# Patient Record
Sex: Female | Born: 2000 | Race: White | Hispanic: No | Smoking: Never smoker
Health system: Southern US, Community
[De-identification: ages and names within clinical notes are randomized; demographics above are authoritative.]

## PROBLEM LIST (undated history)

## (undated) DIAGNOSIS — F988 Other specified behavioral and emotional disorders with onset usually occurring in childhood and adolescence: Secondary | ICD-10-CM

## (undated) HISTORY — PX: WISDOM TOOTH EXTRACTION: SHX21

## (undated) HISTORY — DX: Other specified behavioral and emotional disorders with onset usually occurring in childhood and adolescence: F98.8

---
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
## 2018-07-18 ENCOUNTER — Other Ambulatory Visit: Payer: Self-pay

## 2018-07-18 ENCOUNTER — Encounter (HOSPITAL_COMMUNITY): Payer: Self-pay | Admitting: Emergency Medicine

## 2018-07-18 ENCOUNTER — Emergency Department (HOSPITAL_COMMUNITY): Payer: BLUE CROSS/BLUE SHIELD

## 2018-07-18 ENCOUNTER — Emergency Department (HOSPITAL_COMMUNITY)
Admission: EM | Admit: 2018-07-18 | Discharge: 2018-07-18 | Disposition: A | Discharge status: Home / Self Care | Payer: BLUE CROSS/BLUE SHIELD | Attending: Emergency Medicine | Admitting: Emergency Medicine

## 2018-07-18 DIAGNOSIS — Y939 Activity, unspecified: Secondary | ICD-10-CM | POA: Insufficient documentation

## 2018-07-18 DIAGNOSIS — S8002XA Contusion of left knee, initial encounter: Secondary | ICD-10-CM

## 2018-07-18 DIAGNOSIS — S0990XA Unspecified injury of head, initial encounter: Secondary | ICD-10-CM

## 2018-07-18 DIAGNOSIS — Y999 Unspecified external cause status: Secondary | ICD-10-CM | POA: Insufficient documentation

## 2018-07-18 DIAGNOSIS — Y92410 Unspecified street and highway as the place of occurrence of the external cause: Secondary | ICD-10-CM | POA: Diagnosis not present

## 2018-07-18 DIAGNOSIS — Z79899 Other long term (current) drug therapy: Secondary | ICD-10-CM | POA: Insufficient documentation

## 2018-07-18 NOTE — ED Notes (Signed)
Delay explained to patient. NAD noted. 

## 2018-07-18 NOTE — Discharge Instructions (Addendum)
X-rays were normal.  You will be sore for several days.  Tylenol or ibuprofen for pain.  Elevate knee.  Ice pack.
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
## 2018-12-02 ENCOUNTER — Other Ambulatory Visit: Payer: Self-pay

## 2018-12-02 DIAGNOSIS — Z20822 Contact with and (suspected) exposure to covid-19: Secondary | ICD-10-CM

## 2018-12-04 LAB — NOVEL CORONAVIRUS, NAA: SARS-CoV-2, NAA: NOT DETECTED

## 2020-03-03 ENCOUNTER — Ambulatory Visit (INDEPENDENT_AMBULATORY_CARE_PROVIDER_SITE_OTHER): Payer: BC Managed Care – PPO | Admitting: Nurse Practitioner

## 2020-03-03 ENCOUNTER — Other Ambulatory Visit: Payer: Self-pay

## 2020-03-03 ENCOUNTER — Encounter: Payer: Self-pay | Admitting: Nurse Practitioner

## 2020-03-03 VITALS — BP 123/89 | HR 116 | Temp 98.2°F | Resp 20 | Ht 62.0 in | Wt 149.0 lb

## 2020-03-03 DIAGNOSIS — F909 Attention-deficit hyperactivity disorder, unspecified type: Secondary | ICD-10-CM | POA: Diagnosis not present

## 2020-03-03 DIAGNOSIS — Z7689 Persons encountering health services in other specified circumstances: Secondary | ICD-10-CM | POA: Insufficient documentation

## 2020-03-03 MED ORDER — LISDEXAMFETAMINE DIMESYLATE 20 MG PO CAPS: 20.0000 mg | ORAL_CAPSULE | Freq: Every day | ORAL | 0 refills | Status: DC

## 2020-03-03 NOTE — Patient Instructions (Signed)

## 2020-03-03 NOTE — Assessment & Plan Note (Signed)
Vyvanse reordered. Education provided with printed handouts given Rx sent to pharmacy Follow-up in 3 months, goal is to switch patient to Strattera and nonstimulant ADHD medication.

## 2020-03-03 NOTE — Progress Notes (Signed)
New Patient Note  RE: Sara Petersen MRN: [id number] DOB: 12/10/2000 Date of Office Visit: 03/03/2020  Chief Complaint: Establish Care    History of Present Illness:   Patient is a 20 year old female who presents to clinic for establishing care/follow-up of ADHD.  Patient is reporting difficulty concentrating, patient was first diagnosed for ADHD in fourth grade and has not been reevaluated since then.  Patient is currently on Vyvanse and reports medication is therapeutic for symptoms.  Patient is compliant with current treatment regimen and has no side effects from therapy.  Assessment and Plan:  Sara Petersen is a 20 y.o. female with: Establishing care with new doctor, encounter for Patient is establishing care with practice.  Completed head to toe assessment.  Patient will schedule an appointment for complete physical and labs.  Patient reports a diagnosis of ADHD.  Advised patient to transfer all medical records.  Reordered 1 month refill Vyvanse.  Reevaluation for ADHD by psychiatry needed.  Patient will follow up in 3 months.  Hyperactivity Vyvanse reordered. Education provided with printed handouts given Rx sent to pharmacy Follow-up in 3 months, goal is to switch patient to Strattera and nonstimulant ADHD medication.    Return in about 3 months (around 06/01/2020).   Diagnostics:     Past Medical History: Patient Active Problem List   Diagnosis Date Noted  . Hyperactivity 03/03/2020  . Establishing care with new doctor, encounter for 03/03/2020   Past Medical History:  Diagnosis Date  . ADD (attention deficit disorder)     Past Surgical History: Past Surgical History:  Procedure Laterality Date  . WISDOM TOOTH EXTRACTION     Medication List:  No current outpatient medications on file.   No current facility-administered medications for this visit.  Allergies: No Known Allergies Social History: Social History   Socioeconomic History  . Marital  status: Single    Spouse name: Not on file  . Number of children: Not on file  . Years of education: Not on file  . Highest education level: Not on file  Occupational History  . Not on file  Tobacco Use  . Smoking status: Never Smoker  . Smokeless tobacco: Never Used  Vaping Use  . Vaping Use: Never used  Substance and Sexual Activity  . Alcohol use: No  . Drug use: No  . Sexual activity: Not on file  Other Topics Concern  . Not on file  Social History Narrative  . Not on file   Social Determinants of Health   Financial Resource Strain: Not on file  Food Insecurity: Not on file  Transportation Needs: Not on file  Physical Activity: Not on file  Stress: Not on file  Social Connections: Not on file       Family History: Family History  Problem Relation Age of Onset  . Heart attack Other   . Diabetes Father   . Kidney disease Maternal Grandmother   . Stroke Maternal Grandmother   . Diabetes Maternal Grandfather   . Heart disease Paternal Grandfather          Review of Systems  HENT: Negative.   Eyes: Negative.   Respiratory: Negative.   Cardiovascular: Negative.   Gastrointestinal: Negative.   Genitourinary: Negative.   Musculoskeletal: Negative.   Skin: Negative.   Psychiatric/Behavioral:       Hyperactivity  All other systems reviewed and are negative.  Objective: BP 123/89   Pulse (!) 116   Temp 98.2 F (36.8 C) (Temporal)   Resp 20  Ht 5\' 2"  (1.575 m)   Wt 149 lb (67.6 kg)   SpO2 99%   BMI 27.25 kg/m  Body mass index is 27.25 kg/m. Physical Exam Vitals reviewed.  Constitutional:      General: She is awake.     Appearance: Normal appearance. She is well-groomed.     Interventions: Face mask in place.  HENT:     Head: Normocephalic.     Mouth/Throat:     Mouth: Mucous membranes are moist.     Pharynx: Oropharynx is clear.  Eyes:     Conjunctiva/sclera: Conjunctivae normal.     Pupils: Pupils are equal, round, and reactive to light.   Cardiovascular:     Rate and Rhythm: Normal rate and regular rhythm.     Pulses: Normal pulses.     Heart sounds: Normal heart sounds.  Pulmonary:     Effort: Pulmonary effort is normal.  Abdominal:     General: Bowel sounds are normal.  Musculoskeletal:        General: Normal range of motion.  Skin:    General: Skin is warm.  Neurological:     Mental Status: She is alert and oriented to person, place, and time.  Psychiatric:        Behavior: Behavior is cooperative.    The plan was reviewed with the patient/family, and all questions/concerned were addressed.  It was my pleasure to see today and participate in her care. Please feel free to contact me with any questions or concerns.  Sincerely,  [place] NP [facility]

## 2020-03-03 NOTE — Assessment & Plan Note (Signed)
Patient is establishing care with practice.  Completed head to toe assessment.  Patient will schedule an appointment for complete physical and labs.  Patient reports a diagnosis of ADHD.  Advised patient to transfer all medical records.  Reordered 1 month refill Vyvanse.  Reevaluation for ADHD by psychiatry needed.  Patient will follow up in 3 months.

## 2020-03-10 ENCOUNTER — Telehealth: Payer: Self-pay | Admitting: Nurse Practitioner

## 2020-03-10 NOTE — Telephone Encounter (Signed)
Patient wants to know if JE has received her records ? If so can her vyvanse dose be changed ?

## 2020-03-10 NOTE — Telephone Encounter (Signed)
Pt calling because when Je wrote prescription for vyvanse it was 20 mg and pt stated that she usually takes 40 mg. Made pt appt for 03/23/20 because she stated that Je wanted a blood test done after her records were sent to [place], which they were on 03/08/20

## 2020-03-10 NOTE — Telephone Encounter (Signed)
Lmtcb.

## 2020-03-10 NOTE — Telephone Encounter (Signed)
I will check on Friday,I have not received any fax.  for now the most current dose is what we have in Epic. 20 mg

## 2020-03-11 NOTE — Telephone Encounter (Signed)
Lmtcb.

## 2020-03-15 NOTE — Telephone Encounter (Signed)
No call back  Will contact patient once we get records.  Will send to PCP so she can notify us when she receives  records.
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
## 2020-03-23 ENCOUNTER — Ambulatory Visit: Payer: BC Managed Care – PPO | Admitting: Nurse Practitioner

## 2020-03-30 ENCOUNTER — Ambulatory Visit: Payer: BC Managed Care – PPO | Admitting: Nurse Practitioner

## 2020-03-30 ENCOUNTER — Encounter: Payer: Self-pay | Admitting: Nurse Practitioner

## 2020-03-30 ENCOUNTER — Other Ambulatory Visit: Payer: Self-pay

## 2020-03-30 VITALS — BP 114/76 | HR 91 | Temp 98.1°F | Ht 62.0 in | Wt 150.8 lb

## 2020-03-30 DIAGNOSIS — F988 Other specified behavioral and emotional disorders with onset usually occurring in childhood and adolescence: Secondary | ICD-10-CM | POA: Insufficient documentation

## 2020-03-30 DIAGNOSIS — F908 Attention-deficit hyperactivity disorder, other type: Secondary | ICD-10-CM

## 2020-03-30 DIAGNOSIS — Z0289 Encounter for other administrative examinations: Secondary | ICD-10-CM

## 2020-03-30 NOTE — Addendum Note (Signed)
Addended by: Diamantina Monks on: 03/30/2020 04:21 PM   Modules accepted: Orders

## 2020-03-30 NOTE — Assessment & Plan Note (Signed)
Symptoms controlled on current medication dose. Completed drug contract and UDS Rx sent to pharmacy Follow-up in 3 months.

## 2020-03-30 NOTE — Patient Instructions (Signed)

## 2020-03-30 NOTE — Progress Notes (Signed)
Established Patient Office Visit  Subjective:  Patient ID: Sara Petersen, female    DOB: 13-Apr-2000  Age: 20 y.o. MRN: [id number]  CC:  Chief Complaint  Patient presents with  . Medication Management    HPI [name] presents  Received request for refill of ADHD Medication. Daryll Drown, NP Current Outpatient Medications  Medication Sig Dispense Refill  . lisdexamfetamine (VYVANSE) 20 MG capsule Take 1 capsule (20 mg total) by mouth daily. 30 capsule 0   No current facility-administered medications for this visit.   Last refill date of 03/03/2018 Medication: Vyvanse # dispensed: 30 # days of med left: 3 To be picked up at [facility].  Does [place] seem to have any problems with moodiness, appetite, weight loss, or sleep? no Any complaints by [place] about taking the medications? no When was she last examined for ADHD? 03/09/2018 Who is she seeing for her ADHD symptoms? Lynnell Chad  When is her next appointment due?  April   Past Medical History:  Diagnosis Date  . ADD (attention deficit disorder)     Past Surgical History:  Procedure Laterality Date  . WISDOM TOOTH EXTRACTION      Family History  Problem Relation Age of Onset  . Heart attack Other   . Diabetes Father   . Kidney disease Maternal Grandmother   . Stroke Maternal Grandmother   . Diabetes Maternal Grandfather   . Heart disease Paternal Grandfather     Social History   Socioeconomic History  . Marital status: Single    Spouse name: Not on file  . Number of children: Not on file  . Years of education: Not on file  . Highest education level: Not on file  Occupational History  . Not on file  Tobacco Use  . Smoking status: Never Smoker  . Smokeless tobacco: Never Used  Vaping Use  . Vaping Use: Never used  Substance and Sexual Activity  . Alcohol use: No  . Drug use: No  . Sexual activity: Not on file  Other Topics Concern  . Not on file  Social History  Narrative  . Not on file   Social Determinants of Health   Financial Resource Strain: Not on file  Food Insecurity: Not on file  Transportation Needs: Not on file  Physical Activity: Not on file  Stress: Not on file  Social Connections: Not on file  Intimate Partner Violence: Not on file    Outpatient Medications Prior to Visit  Medication Sig Dispense Refill  . lisdexamfetamine (VYVANSE) 20 MG capsule Take 1 capsule (20 mg total) by mouth daily. 30 capsule 0   No facility-administered medications prior to visit.    No Known Allergies  ROS Review of Systems  Constitutional: Negative.   HENT: Negative.   Eyes: Negative.   Respiratory: Negative.   Cardiovascular: Negative.   Gastrointestinal: Negative.   Endocrine: Negative.   Genitourinary: Negative.   Musculoskeletal: Negative.   Skin: Negative.   Neurological: Negative.   Psychiatric/Behavioral: The patient is not nervous/anxious.   All other systems reviewed and are negative.     Objective:    Physical Exam Vitals reviewed.  Constitutional:      Appearance: Normal appearance.  HENT:     Head: Normocephalic.     Nose: Nose normal.  Eyes:     Conjunctiva/sclera: Conjunctivae normal.  Cardiovascular:     Rate and Rhythm: Normal rate and regular rhythm.     Pulses: Normal pulses.  Heart sounds: Normal heart sounds.  Pulmonary:     Effort: Pulmonary effort is normal.  Skin:    General: Skin is warm.  Neurological:     Mental Status: She is alert and oriented to person, place, and time.  Psychiatric:        Behavior: Behavior normal.     BP 114/76   Pulse 91   Temp 98.1 F (36.7 C)   Ht 5\' 2"  (1.575 m)   Wt 150 lb 12.8 oz (68.4 kg)   LMP 02/28/2020   SpO2 100%   BMI 27.58 kg/m  Wt Readings from Last 3 Encounters:  03/30/20 150 lb 12.8 oz (68.4 kg)  03/03/20 149 lb (67.6 kg)  07/18/18 145 lb (65.8 kg)     Health Maintenance Due  Topic Date Due  . Hepatitis C Screening  Never done  .  HIV Screening  Never done  . PAP-Cervical Cytology Screening  Never done  . PAP SMEAR-Modifier  Never done    There are no preventive care reminders to display for this patient.  No results found for: TSH Lab Results  Component Value Date   WBC 6.2 06/19/2014   HGB 13.2 06/19/2014   HCT 39.4 06/19/2014   MCV 84.5 06/19/2014   PLT 245 06/19/2014   Lab Results  Component Value Date   NA 136 06/19/2014   K 4.1 06/19/2014   CO2 26 06/19/2014   GLUCOSE 100 (H) 06/19/2014   BUN 11 06/19/2014   CREATININE 0.63 06/19/2014   BILITOT 0.5 06/19/2014   ALKPHOS 66 06/19/2014   AST 17 06/19/2014   ALT 12 06/19/2014   PROT 7.4 06/19/2014   ALBUMIN 3.9 06/19/2014   CALCIUM 9.9 06/19/2014   ANIONGAP 10 06/19/2014   No results found for: CHOL No results found for: HDL No results found for: LDLCALC No results found for: TRIG No results found for: CHOLHDL No results found for: 06/21/2014    Assessment & Plan:   Problem List Items Addressed This Visit      Other   Attention deficit disorder - Primary    Symptoms controlled on current medication dose. Completed drug contract and UDS Rx sent to pharmacy Follow-up in 3 months.         No orders of the defined types were placed in this encounter.   Follow-up: No follow-ups on file.    [id number], NP

## 2020-03-31 MED ORDER — LISDEXAMFETAMINE DIMESYLATE 40 MG PO CAPS: 40.0000 mg | ORAL_CAPSULE | ORAL | 0 refills | Status: DC

## 2020-03-31 NOTE — Addendum Note (Signed)
Addended by: Daryll Drown on: 03/31/2020 05:10 PM   Modules accepted: Orders

## 2020-04-05 LAB — TOXASSURE SELECT 13 (MW), URINE

## 2020-07-06 ENCOUNTER — Ambulatory Visit (INDEPENDENT_AMBULATORY_CARE_PROVIDER_SITE_OTHER): Payer: BC Managed Care – PPO | Admitting: Nurse Practitioner

## 2020-07-06 ENCOUNTER — Encounter: Payer: Self-pay | Admitting: Nurse Practitioner

## 2020-07-06 ENCOUNTER — Other Ambulatory Visit: Payer: Self-pay

## 2020-07-06 VITALS — BP 117/85 | HR 90 | Temp 97.3°F | Ht 62.0 in | Wt 148.0 lb

## 2020-07-06 DIAGNOSIS — F908 Attention-deficit hyperactivity disorder, other type: Secondary | ICD-10-CM | POA: Diagnosis not present

## 2020-07-06 DIAGNOSIS — R109 Unspecified abdominal pain: Secondary | ICD-10-CM | POA: Insufficient documentation

## 2020-07-06 DIAGNOSIS — F988 Other specified behavioral and emotional disorders with onset usually occurring in childhood and adolescence: Secondary | ICD-10-CM

## 2020-07-06 LAB — URINALYSIS, ROUTINE W REFLEX MICROSCOPIC
Bilirubin, UA: NEGATIVE
Glucose, UA: NEGATIVE
Leukocytes,UA: NEGATIVE
Nitrite, UA: NEGATIVE
Protein,UA: NEGATIVE
RBC, UA: NEGATIVE
Specific Gravity, UA: 1.02 (ref 1.005–1.030)
Urobilinogen, Ur: 0.2 mg/dL (ref 0.2–1.0)
pH, UA: 6 (ref 5.0–7.5)

## 2020-07-06 LAB — MICROSCOPIC EXAMINATION
Bacteria, UA: NONE SEEN
RBC: NONE SEEN /hpf (ref 0–2)

## 2020-07-06 MED ORDER — LISDEXAMFETAMINE DIMESYLATE 40 MG PO CAPS: 40.0000 mg | ORAL_CAPSULE | ORAL | 0 refills | Status: DC

## 2020-07-06 NOTE — Assessment & Plan Note (Signed)
Urinalysis negative for UTI.  Encourage patient to increase hydration, Tylenol or ibuprofen for flank pain.  Follow-up with worsening unresolved symptoms.

## 2020-07-06 NOTE — Patient Instructions (Signed)
Nausea, Adult Nausea is feeling sick to your stomach or feeling that you are about to throw up (vomit). Feeling sick to your stomach is usually not serious, but it may be an early sign of a more serious medical problem. As you feel sicker to your stomach, you may throw up. If you throw up, or if you are not able to drink enough fluids, there is a risk that you may lose too much water in your body (get dehydrated). If you lose too much water in your body, you may:  Feel tired.  Feel thirsty.  Have a dry mouth.  Have cracked lips.  Go pee (urinate) less often. Older adults and people who have other diseases or a weak body defense system (immune system) have a higher risk of losing too much water in the body. The main goals of treating this condition are:  To relieve your nausea.  To ensure your nausea occurs less often.  To prevent throwing up and losing too much fluid. Follow these instructions at home: Watch your symptoms for any changes. Tell your doctor about them. Follow these instructions as told by your doctor. Eating and drinking  Take an ORS (oral rehydration solution). This is a drink that is sold at pharmacies and stores.  Drink clear fluids in small amounts as you are able. These include: ? Water. ? Ice chips. ? Fruit juice that has water added (diluted fruit juice). ? Low-calorie sports drinks.  Eat bland, easy-to-digest foods in small amounts as you are able, such as: ? Bananas. ? Applesauce. ? Rice. ? Low-fat (lean) meats. ? Toast. ? Crackers.  Avoid drinking fluids that have a lot of sugar or caffeine in them. This includes energy drinks, sports drinks, and soda.  Avoid alcohol.  Avoid spicy or fatty foods.      General instructions  Take over-the-counter and prescription medicines only as told by your doctor.  Rest at home while you get better.  Drink enough fluid to keep your pee (urine) pale yellow.  Take slow and deep breaths when you feel  sick to your stomach.  Avoid food or things that have strong smells.  Wash your hands often with soap and water. If you cannot use soap and water, use hand sanitizer.  Make sure that all people in your home wash their hands well and often.  Keep all follow-up visits as told by your doctor. This is important. Contact a doctor if:  You feel sicker to your stomach.  You feel sick to your stomach for more than 2 days.  You throw up.  You are not able to drink fluids without throwing up.  You have new symptoms.  You have a fever.  You have a headache.  You have muscle cramps.  You have a rash.  You have pain while peeing.  You feel light-headed or dizzy. Get help right away if:  You have pain in your chest, neck, arm, or jaw.  You feel very weak or you pass out (faint).  You have throw up that is bright red or looks like coffee grounds.  You have bloody or black poop (stools) or poop that looks like tar.  You have a very bad headache, a stiff neck, or both.  You have very bad pain, cramping, or bloating in your belly (abdomen).  You have trouble breathing or you are breathing very quickly.  Your heart is beating very quickly.  Your skin feels cold and clammy.  You feel confused.  You have signs of losing too much water in your body, such as: ? Dark pee, very little pee, or no pee. ? Cracked lips. ? Dry mouth. ? Sunken eyes. ? Sleepiness. ? Weakness. These symptoms may be an emergency. Do not wait to see if the symptoms will go away. Get medical help right away. Call your local emergency services (911 in the [place] Do not drive yourself to the hospital. Summary  Nausea is feeling sick to your stomach or feeling that you are about to throw up (vomit).  If you throw up, or if you are not able to drink enough fluids, there is a risk that you may lose too much water in your body (get dehydrated).  Eat and drink what your doctor tells you. Take  over-the-counter and prescription medicines only as told by your doctor.  Contact a doctor right away if your symptoms get worse or you have new symptoms.  Keep all follow-up visits as told by your doctor. This is important. This information is not intended to replace advice given to you by your health care provider. Make sure you discuss any questions you have with your health care provider. Document Revised: 01/14/2019 Document Reviewed: 07/24/2017 Elsevier Patient Education  2021 [employer] Inc. Attention Deficit Hyperactivity Disorder, Adult Attention deficit hyperactivity disorder (ADHD) is a mental health disorder that starts during childhood (neurodevelopmental disorder). For many people with ADHD, the disorder continues into the adult years. Treatment can help you manage your symptoms. What are the causes? The exact cause of ADHD is not known. Most experts believe genetics and environmental factors contribute to ADHD. What increases the risk? The following factors may make you more likely to develop this condition:  Having a family history of ADHD.  Being female.  Being born to a mother who smoked or drank alcohol during pregnancy.  Being exposed to lead or other toxins in the womb or early in life.  Being born before 37 weeks of pregnancy (prematurely) or at a low birth weight.  Having experienced a brain injury. What are the signs or symptoms? Symptoms of this condition depend on the type of ADHD. The two main types are inattentive and hyperactive-impulsive. Some people may have symptoms of both types. Symptoms of the inattentive type include:  Difficulty paying attention.  Making careless mistakes.  Not following instructions.  Being disorganized.  Avoiding tasks that require time and attention.  Losing and forgetting things.  Being easily distracted. Symptoms of the hyperactive-impulsive type include:  Restlessness.  Talking too  much.  Interrupting.  Difficulty with: ? Sitting still. ? Feeling motivated. ? Relaxing. ? Waiting in line or waiting for a turn. In adults, this condition may lead to certain problems, such as:  Keeping jobs.  Performing tasks at work.  Having stable relationships.  Being on time or keeping to a schedule. How is this diagnosed? This condition is diagnosed based on your current symptoms and your history of symptoms. The diagnosis can be made by a health care provider such as a primary care provider or a mental health care specialist. Your health care provider may use a symptom checklist or a behavior rating scale to evaluate your symptoms. He or she may also want to talk with people who have observed your behaviors throughout your life. How is this treated? This condition can be treated with medicines and behavior therapy. Medicines may be the best option to reduce impulsive behaviors and improve attention. Your health care provider may recommend:  Stimulant  medicines. These are the most common medicines used for adult ADHD. They affect certain chemicals in the brain (neurotransmitters) and improve your ability to control your symptoms.  A non-stimulant medicine for adult ADHD (atomoxetine). This medicine increases a neurotransmitter called norepinephrine. It may take weeks to months to see effects from this medicine. Counseling and behavioral management are also important for treating ADHD. Counseling is often used along with medicine. Your health care provider may suggest:  Cognitive behavioral therapy (CBT). This type of therapy teaches you to replace negative thoughts and actions with positive thoughts and actions. When used as part of ADHD treatment, this therapy may also include: ? Coping strategies for organization, time management, impulse control, and stress reduction. ? Mindfulness and meditation training.  Behavioral management. You may work with a Psychologist, occupational who is specially  trained to help people with ADHD manage and organize activities and function more effectively. Follow these instructions at home: Medicines  Take over-the-counter and prescription medicines only as told by your health care provider.  Talk with your health care provider about the possible side effects of your medicines and how to manage them.   Lifestyle  Do not use drugs.  Do not drink alcohol if: ? Your health care provider tells you not to drink. ? You are pregnant, may be pregnant, or are planning to become pregnant.  If you drink alcohol: ? Limit how much you use to:  0-1 drink a day for women.  0-2 drinks a day for men. ? Be aware of how much alcohol is in your drink. In the [place] one drink equals one 12 oz bottle of beer (355 mL), one 5 oz glass of wine (148 mL), or one 1 oz glass of hard liquor (44 mL).  Get enough sleep.  Eat a healthy diet.  Exercise regularly. Exercise can help to reduce stress and anxiety.   General instructions  Learn as much as you can about adult ADHD, and work closely with your health care providers to find the treatments that work best for you.  Follow the same schedule each day.  Use reminder devices like notes, calendars, and phone apps to stay on time and organized.  Keep all follow-up visits as told by your health care provider and therapist. This is important. Where to find more information A health care provider may be able to recommend resources that are available online or over the phone. You could start with:  Attention Deficit Disorder Association (ADDA): http://davis-dillon.net/  [employer] of Mental Health [facility]): http://www.maynard.net/ Contact a health care provider if:  Your symptoms continue to cause problems.  You have side effects from your medicine, such as: ? Repeated muscle twitches, coughing, or speech outbursts. ? Sleep problems. ? Loss of appetite. ? Dizziness. ? Unusually fast heartbeat. ? Stomach  pains. ? Headaches.  You are struggling with anxiety, depression, or substance abuse. Get help right away if you:  Have a severe reaction to a medicine. If you ever feel like you may hurt yourself or others, or have thoughts about taking your own life, get help right away. You can go to the nearest emergency department or call:  Your local emergency services (911 in the [place]  A suicide crisis helpline, such as the National Suicide Prevention Lifeline at (747) 244-1105. This is open 24 hours a day. Summary  ADHD is a mental health disorder that starts during childhood (neurodevelopmental disorder) and often continues into the adult years.  The exact cause of ADHD is not  known. Most experts believe genetics and environmental factors contribute to ADHD.  There is no cure for ADHD, but treatment with medicine, cognitive behavioral therapy, or behavioral management can help you manage your condition. This information is not intended to replace advice given to you by your health care provider. Make sure you discuss any questions you have with your health care provider. Document Revised: 07/08/2018 Document Reviewed: 07/08/2018 Elsevier Patient Education  2021 [employer].

## 2020-07-06 NOTE — Assessment & Plan Note (Signed)
Continue current medication dose no changes necessary.  Symptoms are well controlled.  Education provided to patient with printed handouts given.  Rx sent to pharmacy.  Follow-up in 3 months.

## 2020-07-06 NOTE — Progress Notes (Signed)
Established Patient Office Visit  Subjective:  Patient ID: Sara Petersen, female    DOB: 2000-05-04  Age: 20 y.o. MRN: [id number]  CC:  Chief Complaint  Patient presents with  . Flank Pain  . Medication Refill    HPI Sara Petersen presents for nausea in the past 3 days.  Patient also reports left flank pain that has subsided in the last 24 hours.  Patient states in the past with these symptoms she was positive for urinary tract infection.  Reporting nausea, vomited once in the last 24 hours and reduced appetite.    Concerning patient's ADD patient is reporting compliance with medication with no side effects.  No new signs/worsening symptoms of ADHD.  She reports well managed attention and cognitive functions in day today activity and at school.  Past Medical History:  Diagnosis Date  . ADD (attention deficit disorder)     Past Surgical History:  Procedure Laterality Date  . WISDOM TOOTH EXTRACTION      Family History  Problem Relation Age of Onset  . Heart attack Other   . Diabetes Father   . Kidney disease Maternal Grandmother   . Stroke Maternal Grandmother   . Diabetes Maternal Grandfather   . Heart disease Paternal Grandfather     Social History   Socioeconomic History  . Marital status: Single    Spouse name: Not on file  . Number of children: Not on file  . Years of education: Not on file  . Highest education level: Not on file  Occupational History  . Not on file  Tobacco Use  . Smoking status: Never Smoker  . Smokeless tobacco: Never Used  Vaping Use  . Vaping Use: Every day  . Substances: Nicotine  Substance and Sexual Activity  . Alcohol use: No  . Drug use: No  . Sexual activity: Yes    Birth control/protection: None  Other Topics Concern  . Not on file  Social History Narrative  . Not on file   Social Determinants of Health   Financial Resource Strain: Not on file  Food Insecurity: Not on file  Transportation Needs: Not on  file  Physical Activity: Not on file  Stress: Not on file  Social Connections: Not on file  Intimate Partner Violence: Not on file    Outpatient Medications Prior to Visit  Medication Sig Dispense Refill  . lisdexamfetamine (VYVANSE) 40 MG capsule Take 1 capsule (40 mg total) by mouth every morning. 30 capsule 0  . lisdexamfetamine (VYVANSE) 40 MG capsule Take 1 capsule (40 mg total) by mouth every morning. 30 capsule 0  . lisdexamfetamine (VYVANSE) 40 MG capsule Take 1 capsule (40 mg total) by mouth every morning. 30 capsule 0   No facility-administered medications prior to visit.     ROS Review of Systems  HENT: Negative.   Respiratory: Negative.   Gastrointestinal: Positive for nausea.  Genitourinary: Negative.   Musculoskeletal: Negative.   Skin: Negative.   All other systems reviewed and are negative.     Objective:    Physical Exam Vitals and nursing note reviewed.  Constitutional:      Appearance: Normal appearance.  HENT:     Head: Normocephalic.     Nose: Nose normal.  Eyes:     Conjunctiva/sclera: Conjunctivae normal.  Cardiovascular:     Rate and Rhythm: Normal rate and regular rhythm.     Pulses: Normal pulses.     Heart sounds: Normal heart sounds.  Pulmonary:  Effort: Pulmonary effort is normal.     Breath sounds: Normal breath sounds.  Abdominal:     General: Bowel sounds are normal.     Tenderness: There is no abdominal tenderness. There is no right CVA tenderness, left CVA tenderness or guarding.  Musculoskeletal:        General: No tenderness.  Skin:    Findings: No rash.  Neurological:     Mental Status: She is alert and oriented to person, place, and time.  Psychiatric:        Behavior: Behavior normal.     BP 117/85   Pulse 90   Temp (!) 97.3 F (36.3 C) (Temporal)   Ht 5\' 2"  (1.575 m)   Wt 148 lb (67.1 kg)   LMP 07/02/2020   SpO2 99%   BMI 27.07 kg/m  Wt Readings from Last 3 Encounters:  07/06/20 148 lb (67.1 kg)   03/30/20 150 lb 12.8 oz (68.4 kg)  03/03/20 149 lb (67.6 kg)     Health Maintenance Due  Topic Date Due  . HIV Screening  Never done  . Hepatitis C Screening  Never done    There are no preventive care reminders to display for this patient.  No results found for: TSH Lab Results  Component Value Date   WBC 6.2 06/19/2014   HGB 13.2 06/19/2014   HCT 39.4 06/19/2014   MCV 84.5 06/19/2014   PLT 245 06/19/2014   Lab Results  Component Value Date   NA 136 06/19/2014   K 4.1 06/19/2014   CO2 26 06/19/2014   GLUCOSE 100 (H) 06/19/2014   BUN 11 06/19/2014   CREATININE 0.63 06/19/2014   BILITOT 0.5 06/19/2014   ALKPHOS 66 06/19/2014   AST 17 06/19/2014   ALT 12 06/19/2014   PROT 7.4 06/19/2014   ALBUMIN 3.9 06/19/2014   CALCIUM 9.9 06/19/2014   ANIONGAP 10 06/19/2014      Assessment & Plan:   Problem List Items Addressed This Visit      Other   Attention deficit disorder    Continue current medication dose no changes necessary.  Symptoms are well controlled.  Education provided to patient with printed handouts given.  Rx sent to pharmacy.  Follow-up in 3 months.      Flank pain - Primary    Urinalysis negative for UTI.  Encourage patient to increase hydration, Tylenol or ibuprofen for flank pain.  Follow-up with worsening unresolved symptoms.      Relevant Orders   Urinalysis, Routine w reflex microscopic       Follow-up: Return in about 3 months (around 10/06/2020).    12/06/2020, NP

## 2020-10-11 ENCOUNTER — Ambulatory Visit: Payer: BC Managed Care – PPO | Admitting: Nurse Practitioner

## 2020-10-11 ENCOUNTER — Other Ambulatory Visit: Payer: Self-pay

## 2020-10-11 VITALS — BP 123/84 | HR 88 | Temp 97.8°F | Ht 62.0 in | Wt 151.0 lb

## 2020-10-11 DIAGNOSIS — Z79899 Other long term (current) drug therapy: Secondary | ICD-10-CM | POA: Diagnosis not present

## 2020-10-11 DIAGNOSIS — F988 Other specified behavioral and emotional disorders with onset usually occurring in childhood and adolescence: Secondary | ICD-10-CM | POA: Diagnosis not present

## 2020-10-11 MED ORDER — LISDEXAMFETAMINE DIMESYLATE 40 MG PO CAPS: 40.0000 mg | ORAL_CAPSULE | ORAL | 0 refills | Status: DC

## 2020-10-11 NOTE — Assessment & Plan Note (Signed)
Continue on current dose no changes to current medication, symptoms are well controlled.  Completed UDS results pending.  Will provide a months coverage until UDS comes back and will complete the remaining 2 months prescription. Follow-up in 3 months.

## 2020-10-11 NOTE — Patient Instructions (Signed)
Attention Deficit Hyperactivity Disorder, Adult Attention deficit hyperactivity disorder (ADHD) is a mental health disorder that starts during childhood (neurodevelopmental disorder). For many people with ADHD, the disorder continues into the adult years.Treatment can help you manage your symptoms. What are the causes? The exact cause of ADHD is not known. Most experts believe genetics andenvironmental factors contribute to ADHD. What increases the risk? The following factors may make you more likely to develop this condition: Having a family history of ADHD. Being female. Being born to a mother who smoked or drank alcohol during pregnancy. Being exposed to lead or other toxins in the womb or early in life. Being born before 37 weeks of pregnancy (prematurely) or at a low birth weight. Having experienced a brain injury. What are the signs or symptoms? Symptoms of this condition depend on the type of ADHD. The two main types are inattentive and hyperactive-impulsive. Some people may have symptoms of bothtypes. Symptoms of the inattentive type include: Difficulty paying attention. Making careless mistakes. Not following instructions. Being disorganized. Avoiding tasks that require time and attention. Losing and forgetting things. Being easily distracted. Symptoms of the hyperactive-impulsive type include: Restlessness. Talking too much. Interrupting. Difficulty with: Sitting still. Feeling motivated. Relaxing. Waiting in line or waiting for a turn. In adults, this condition may lead to certain problems, such as: Keeping jobs. Performing tasks at work. Having stable relationships. Being on time or keeping to a schedule. How is this diagnosed? This condition is diagnosed based on your current symptoms and your history of symptoms. The diagnosis can be made by a health care provider such as a primarycare provider or a mental health care specialist. Your health care provider may use a  symptom checklist or a behavior rating scale to evaluate your symptoms. He or she may also want to talk with peoplewho have observed your behaviors throughout your life. How is this treated? This condition can be treated with medicines and behavior therapy. Medicines may be the best option to reduce impulsive behaviors and improve attention. Your health care provider may recommend: Stimulant medicines. These are the most common medicines used for adult ADHD. They affect certain chemicals in the brain (neurotransmitters) and improve your ability to control your symptoms. A non-stimulant medicine for adult ADHD (atomoxetine). This medicine increases a neurotransmitter called norepinephrine. It may take weeks to months to see effects from this medicine. Counseling and behavioral management are also important for treating ADHD. Counseling is often used along with medicine. Your health care provider may suggest: Cognitive behavioral therapy (CBT). This type of therapy teaches you to replace negative thoughts and actions with positive thoughts and actions. When used as part of ADHD treatment, this therapy may also include: Coping strategies for organization, time management, impulse control, and stress reduction. Mindfulness and meditation training. Behavioral management. You may work with a coach who is specially trained to help people with ADHD manage and organize activities and function more effectively. Follow these instructions at home: Medicines  Take over-the-counter and prescription medicines only as told by your health care provider. Talk with your health care provider about the possible side effects of your medicines and how to manage them.  Lifestyle  Do not use drugs. Do not drink alcohol if: Your health care provider tells you not to drink. You are pregnant, may be pregnant, or are planning to become pregnant. If you drink alcohol: Limit how much you use to: 0-1 drink a day for  women. 0-2 drinks a day for men. Be aware   of how much alcohol is in your drink. In the [place] one drink equals one 12 oz bottle of beer (355 mL), one 5 oz glass of wine (148 mL), or one 1 oz glass of hard liquor (44 mL). Get enough sleep. Eat a healthy diet. Exercise regularly. Exercise can help to reduce stress and anxiety.  General instructions Learn as much as you can about adult ADHD, and work closely with your health care providers to find the treatments that work best for you. Follow the same schedule each day. Use reminder devices like notes, calendars, and phone apps to stay on time and organized. Keep all follow-up visits as told by your health care provider and therapist. This is important. Where to find more information A health care provider may be able to recommend resources that are available online or over the phone. You could start with: Attention Deficit Disorder Association (ADDA): www.add.org National Institute of Mental Health (NIMH): www.nimh.nih.gov Contact a health care provider if: Your symptoms continue to cause problems. You have side effects from your medicine, such as: Repeated muscle twitches, coughing, or speech outbursts. Sleep problems. Loss of appetite. Dizziness. Unusually fast heartbeat. Stomach pains. Headaches. You are struggling with anxiety, depression, or substance abuse. Get help right away if you: Have a severe reaction to a medicine. If you ever feel like you may hurt yourself or others, or have thoughts about taking your own life, get help right away. You can go to the nearest emergency department or call: Your local emergency services (911 in the [place] A suicide crisis helpline, such as the National Suicide Prevention Lifeline at 1-800-273-8255. This is open 24 hours a day. Summary ADHD is a mental health disorder that starts during childhood (neurodevelopmental disorder) and often continues into the adult years. The exact cause of ADHD  is not known. Most experts believe genetics and environmental factors contribute to ADHD. There is no cure for ADHD, but treatment with medicine, cognitive behavioral therapy, or behavioral management can help you manage your condition. This information is not intended to replace advice given to you by your health care provider. Make sure you discuss any questions you have with your healthcare provider. Document Revised: 07/08/2018 Document Reviewed: 07/08/2018 Elsevier Patient Education  2022 [employer] Inc.  

## 2020-10-11 NOTE — Progress Notes (Signed)
Established Patient Office Visit  Subjective:  Patient ID: Sara Petersen, female    DOB: 2000/02/28  Age: 20 y.o. 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  Patient is doing well and symptoms are controlled on 40 mg Vyvanse.  Patient is compliant and has no side effect from medication dose she follows up as directed.  Past Medical History:  Diagnosis Date   ADD (attention deficit disorder)     Past Surgical History:  Procedure Laterality Date   WISDOM TOOTH EXTRACTION      Family History  Problem Relation Age of Onset   Heart attack Other    Diabetes Father    Kidney disease Maternal Grandmother    Stroke Maternal Grandmother    Diabetes Maternal Grandfather    Heart disease Paternal Grandfather     Social History   Socioeconomic History   Marital status: Single    Spouse name: Not on file   Number of children: Not on file   Years of education: Not on file   Highest education level: Not on file  Occupational History   Not on file  Tobacco Use   Smoking status: Never   Smokeless tobacco: Never  Vaping Use   Vaping Use: Every day   Substances: Nicotine  Substance and Sexual Activity   Alcohol use: No   Drug use: No   Sexual activity: Yes    Birth control/protection: None  Other Topics Concern   Not on file  Social History Narrative   Not on file   Social Determinants of Health   Financial Resource Strain: Not on file  Food Insecurity: Not on file  Transportation Needs: Not on file  Physical Activity: Not on file  Stress: Not on file  Social Connections: Not on file  Intimate Partner Violence: Not on file    Outpatient Medications Prior to Visit  Medication Sig Dispense Refill   lisdexamfetamine (VYVANSE) 40 MG capsule Take 1 capsule (40 mg total) by mouth every morning. 30 capsule 0   lisdexamfetamine (VYVANSE)  40 MG capsule Take 1 capsule (40 mg total) by mouth every morning. 30 capsule 0   lisdexamfetamine (VYVANSE) 40 MG capsule Take 1 capsule (40 mg total) by mouth every morning. 30 capsule 0   No facility-administered medications prior to visit.    No Known Allergies  ROS Review of Systems  Constitutional: Negative.   HENT: Negative.    Respiratory: Negative.    Gastrointestinal: Negative.   Musculoskeletal: Negative.   Skin:  Negative for rash.  All other systems reviewed and are negative.    Objective:    Physical Exam Vitals and nursing note reviewed.  Constitutional:      Appearance: Normal appearance.  HENT:     Head: Normocephalic.     Nose: Nose normal.  Eyes:     Conjunctiva/sclera: Conjunctivae normal.  Cardiovascular:     Rate and Rhythm: Normal rate and regular rhythm.  Pulmonary:     Effort: Pulmonary effort is normal.     Breath sounds: Normal breath sounds.  Abdominal:     General: Bowel sounds are normal.  Skin:    Findings: No rash.  Neurological:     Mental Status: She is alert and oriented to person, place, and time.  Psychiatric:        Behavior: Behavior normal.    BP 123/84  Pulse 88   Temp 97.8 F (36.6 C) (Temporal)   Ht 5\' 2"  (1.575 m)   Wt 151 lb (68.5 kg)   BMI 27.62 kg/m  Wt Readings from Last 3 Encounters:  10/11/20 151 lb (68.5 kg)  07/06/20 148 lb (67.1 kg)  03/30/20 150 lb 12.8 oz (68.4 kg)     Health Maintenance Due  Topic Date Due   COVID-19 Vaccine (1) Never done   HIV Screening  Never done   Hepatitis C Screening  Never done   PAP SMEAR-Modifier  Never done   INFLUENZA VACCINE  09/27/2020    There are no preventive care reminders to display for this patient.  No results found for: TSH Lab Results  Component Value Date   WBC 6.2 06/19/2014   HGB 13.2 06/19/2014   HCT 39.4 06/19/2014   MCV 84.5 06/19/2014   PLT 245 06/19/2014   Lab Results  Component Value Date   NA 136 06/19/2014   K 4.1 06/19/2014    CO2 26 06/19/2014   GLUCOSE 100 (H) 06/19/2014   BUN 11 06/19/2014   CREATININE 0.63 06/19/2014   BILITOT 0.5 06/19/2014   ALKPHOS 66 06/19/2014   AST 17 06/19/2014   ALT 12 06/19/2014   PROT 7.4 06/19/2014   ALBUMIN 3.9 06/19/2014   CALCIUM 9.9 06/19/2014   ANIONGAP 10 06/19/2014      Assessment & Plan:   Problem List Items Addressed This Visit       Other   ADD (attention deficit disorder) without hyperactivity - Primary    Continue on current dose no changes to current medication, symptoms are well controlled.  Completed UDS results pending.  Will provide a months coverage until UDS comes back and will complete the remaining 2 months prescription. Follow-up in 3 months.       Relevant Medications   lisdexamfetamine (VYVANSE) 40 MG capsule   Controlled substance agreement signed   Relevant Orders   ToxASSURE Select 13 (MW), Urine    Meds ordered this encounter  Medications   lisdexamfetamine (VYVANSE) 40 MG capsule    Sig: Take 1 capsule (40 mg total) by mouth every morning.    Dispense:  30 capsule    Refill:  0    Order Specific Question:   Supervising Provider    Answer:   06/21/2014 Raliegh Ip     Follow-up: No follow-ups on file.    [[id number]], NP

## 2020-10-14 LAB — TOXASSURE SELECT 13 (MW), URINE

## 2022-06-27 ENCOUNTER — Inpatient Hospital Stay (HOSPITAL_COMMUNITY)
Admission: AD | Admit: 2022-06-27 | Discharge: 2022-06-28 | Disposition: A | Discharge status: Home / Self Care | Payer: BC Managed Care – PPO | Attending: Obstetrics and Gynecology | Admitting: Obstetrics and Gynecology

## 2022-06-27 ENCOUNTER — Encounter (HOSPITAL_COMMUNITY): Payer: Self-pay | Admitting: *Deleted

## 2022-06-27 ENCOUNTER — Inpatient Hospital Stay (HOSPITAL_COMMUNITY): Payer: BC Managed Care – PPO

## 2022-06-27 DIAGNOSIS — Z3A01 Less than 8 weeks gestation of pregnancy: Secondary | ICD-10-CM

## 2022-06-27 DIAGNOSIS — O3680X0 Pregnancy with inconclusive fetal viability, not applicable or unspecified: Secondary | ICD-10-CM | POA: Diagnosis not present

## 2022-06-27 DIAGNOSIS — O209 Hemorrhage in early pregnancy, unspecified: Secondary | ICD-10-CM | POA: Insufficient documentation

## 2022-06-27 LAB — CBC
HCT: 37.3 % (ref 36.0–46.0)
Hemoglobin: 12.8 g/dL (ref 12.0–15.0)
MCH: 31 pg (ref 26.0–34.0)
MCHC: 34.3 g/dL (ref 30.0–36.0)
MCV: 90.3 fL (ref 80.0–100.0)
Platelets: 250 10*3/uL (ref 150–400)
RBC: 4.13 MIL/uL (ref 3.87–5.11)
RDW: 12 % (ref 11.5–15.5)
WBC: 11.2 10*3/uL — ABNORMAL HIGH (ref 4.0–10.5)
nRBC: 0 % (ref 0.0–0.2)

## 2022-06-27 LAB — URINALYSIS, ROUTINE W REFLEX MICROSCOPIC
Bacteria, UA: NONE SEEN
Bilirubin Urine: NEGATIVE
Glucose, UA: NEGATIVE mg/dL
Ketones, ur: 5 mg/dL — AB
Leukocytes,Ua: NEGATIVE
Nitrite: NEGATIVE
Protein, ur: NEGATIVE mg/dL
Specific Gravity, Urine: 1.005 (ref 1.005–1.030)
pH: 6 (ref 5.0–8.0)

## 2022-06-27 LAB — WET PREP, GENITAL
Sperm: NONE SEEN
Trich, Wet Prep: NONE SEEN
WBC, Wet Prep HPF POC: <10 (ref <10)
Yeast Wet Prep HPF POC: NONE SEEN

## 2022-06-27 LAB — POCT PREGNANCY, URINE: Preg Test, Ur: POSITIVE — AB

## 2022-06-27 NOTE — MAU Note (Addendum)
.  Sara Petersen is a 22 y.o. at Unknown here in MAU reporting vaginal bleeding about 2130. Mild intermittent cramping. Last intercourse was Sunday. Starts PNC at [facility] in [place] 07/11/22 LMP: 05/14/22 Onset of complaint: 2130 Pain score: 6 Vitals:   06/27/22 2226  BP: 131/75  Pulse: 96  Resp: 17  Temp: 98.4 F (36.9 C)  SpO2: 100%     FHT:n/a Lab orders placed from triage:  upt and u/a

## 2022-06-28 DIAGNOSIS — O209 Hemorrhage in early pregnancy, unspecified: Secondary | ICD-10-CM | POA: Insufficient documentation

## 2022-06-28 DIAGNOSIS — Z3A01 Less than 8 weeks gestation of pregnancy: Secondary | ICD-10-CM

## 2022-06-28 DIAGNOSIS — O3680X0 Pregnancy with inconclusive fetal viability, not applicable or unspecified: Secondary | ICD-10-CM | POA: Diagnosis not present

## 2022-06-28 LAB — BASIC METABOLIC PANEL
Anion gap: 11 (ref 5–15)
BUN: 6 mg/dL (ref 6–20)
CO2: 22 mmol/L (ref 22–32)
Calcium: 9.5 mg/dL (ref 8.9–10.3)
Chloride: 103 mmol/L (ref 98–111)
Creatinine, Ser: 0.59 mg/dL (ref 0.44–1.00)
GFR, Estimated: >60 mL/min (ref >60)
Glucose, Bld: 100 mg/dL — ABNORMAL HIGH (ref 70–99)
Potassium: 3.3 mmol/L — ABNORMAL LOW (ref 3.5–5.1)
Sodium: 136 mmol/L (ref 135–145)

## 2022-06-28 LAB — GC/CHLAMYDIA PROBE AMP (~~LOC~~) NOT AT ARMC
Chlamydia: NEGATIVE
Comment: NEGATIVE
Comment: NORMAL
Neisseria Gonorrhea: NEGATIVE

## 2022-06-28 LAB — ABO/RH: ABO/RH(D): A POS

## 2022-06-28 LAB — HCG, QUANTITATIVE, PREGNANCY: hCG, Beta Chain, Quant, S: 40341 m[IU]/mL — ABNORMAL HIGH (ref <5)

## 2022-06-28 LAB — HIV ANTIBODY (ROUTINE TESTING W REFLEX): HIV Screen 4th Generation wRfx: NONREACTIVE

## 2022-06-28 NOTE — MAU Note (Signed)
Wynelle Bourgeois CNM in Family Rm to see pt and discuss test results and d/c plan.

## 2022-06-28 NOTE — MAU Provider Note (Signed)
Chief Complaint: Vaginal Bleeding   Event Date/Time   First Provider Initiated Contact with Patient 06/28/22 0039        SUBJECTIVE HPI: Sara Petersen is a 22 y.o. G1P0 at [redacted]w[redacted]d by LMP who presents to maternity admissions reporting vaginal bleeding since 2130.  Also has some mild cramping. She denies vaginal itching/burning, urinary symptoms, h/a, dizziness, n/v, or fever/chills.    Has new OB appointment scheduled for May 14.  Vaginal Bleeding The patient's primary symptoms include pelvic pain (mild) and vaginal bleeding. The patient's pertinent negatives include no genital itching or genital odor. This is a new problem. The current episode started today. She is pregnant. Associated symptoms include abdominal pain. Pertinent negatives include no chills, fever or frequency. The vaginal discharge was bloody. The vaginal bleeding is lighter than menses. She has not been passing clots. She has not been passing tissue. Nothing aggravates the symptoms. She has tried nothing for the symptoms.   RN Note: Sara Petersen is a 22 y.o. at Unknown here in MAU reporting vaginal bleeding about 2130. Mild intermittent cramping. Last intercourse was Sunday. Starts PNC at [facility] in [place] 07/11/22 LMP: 05/14/22 Onset of complaint: 2130              Pain score: 6  Past Medical History:  Diagnosis Date   ADD (attention deficit disorder)    Past Surgical History:  Procedure Laterality Date   WISDOM TOOTH EXTRACTION     Social History   Socioeconomic History   Marital status: Significant Other    Spouse name: Not on file   Number of children: Not on file   Years of education: Not on file   Highest education level: Not on file  Occupational History   Not on file  Tobacco Use   Smoking status: Never   Smokeless tobacco: Never  Vaping Use   Vaping Use: Every day   Substances: Nicotine  Substance and Sexual Activity   Alcohol use: No   Drug use: No   Sexual activity: Yes    Birth  control/protection: None  Other Topics Concern   Not on file  Social History Narrative   Not on file   Social Determinants of Health   Financial Resource Strain: Not on file  Food Insecurity: Not on file  Transportation Needs: Not on file  Physical Activity: Not on file  Stress: Not on file  Social Connections: Not on file  Intimate Partner Violence: Not on file   No current facility-administered medications on file prior to encounter.   Current Outpatient Medications on File Prior to Encounter  Medication Sig Dispense Refill   lisdexamfetamine (VYVANSE) 40 MG capsule Take 1 capsule (40 mg total) by mouth every morning. 30 capsule 0   No Known Allergies  I have reviewed patient's Past Medical Hx, Surgical Hx, Family Hx, Social Hx, medications and allergies.   ROS:  Review of Systems  Constitutional:  Negative for chills and fever.  Gastrointestinal:  Positive for abdominal pain.  Genitourinary:  Positive for pelvic pain (mild) and vaginal bleeding. Negative for frequency.   Review of Systems  Other systems negative   Physical Exam  Physical Exam Patient Vitals for the past 24 hrs:  BP Temp Pulse Resp SpO2 Height Weight  06/27/22 2226 131/75 98.4 F (36.9 C) 96 17 100 % 5\' 2"  (1.575 m) 57.6 kg   Constitutional: Well-developed, well-nourished female in no acute distress.  Cardiovascular: normal rate Respiratory: normal effort GI: Abd soft, non-tender.  MS: Extremities  nontender, normal ROM Neurologic: Alert and oriented x 4.  GU: Neg CVAT.  PELVIC EXAM: deferred in lieu of transvaginal ultrasound  LAB RESULTS Results for orders placed or performed during the hospital encounter of 06/27/22 (from the past 24 hour(s))  Urinalysis, Routine w reflex microscopic -Urine, Clean Catch     Status: Abnormal   Collection Time: 06/27/22 10:35 PM  Result Value Ref Range   Color, Urine STRAW (A) YELLOW   APPearance HAZY (A) CLEAR   Specific Gravity, Urine 1.005 1.005 -  1.030   pH 6.0 5.0 - 8.0   Glucose, UA NEGATIVE NEGATIVE mg/dL   Hgb urine dipstick LARGE (A) NEGATIVE   Bilirubin Urine NEGATIVE NEGATIVE   Ketones, ur 5 (A) NEGATIVE mg/dL   Protein, ur NEGATIVE NEGATIVE mg/dL   Nitrite NEGATIVE NEGATIVE   Leukocytes,Ua NEGATIVE NEGATIVE   RBC / HPF 0-5 0 - 5 RBC/hpf   WBC, UA 0-5 0 - 5 WBC/hpf   Bacteria, UA NONE SEEN NONE SEEN   Squamous Epithelial / HPF 6-10 0 - 5 /HPF   Mucus PRESENT   Pregnancy, urine POC     Status: Abnormal   Collection Time: 06/27/22 10:38 PM  Result Value Ref Range   Preg Test, Ur POSITIVE (A) NEGATIVE  ABO/Rh     Status: None   Collection Time: 06/27/22 11:25 PM  Result Value Ref Range   ABO/RH(D) A POS    No rh immune globuloin      NOT A RH IMMUNE GLOBULIN CANDIDATE, PT RH POSITIVE Performed at [facility], [address], [place], [place] [address]   CBC     Status: Abnormal   Collection Time: 06/27/22 11:25 PM  Result Value Ref Range   WBC 11.2 (H) 4.0 - 10.5 K/uL   RBC 4.13 3.87 - 5.11 MIL/uL   Hemoglobin 12.8 12.0 - 15.0 g/dL   HCT 60.4 54.0 - 98.1 %   MCV 90.3 80.0 - 100.0 fL   MCH 31.0 26.0 - 34.0 pg   MCHC 34.3 30.0 - 36.0 g/dL   RDW 19.1 47.8 - 29.5 %   Platelets 250 150 - 400 K/uL   nRBC 0.0 0.0 - 0.2 %  Basic metabolic panel     Status: Abnormal   Collection Time: 06/27/22 11:25 PM  Result Value Ref Range   Sodium 136 135 - 145 mmol/L   Potassium 3.3 (L) 3.5 - 5.1 mmol/L   Chloride 103 98 - 111 mmol/L   CO2 22 22 - 32 mmol/L   Glucose, Bld 100 (H) 70 - 99 mg/dL   BUN 6 6 - 20 mg/dL   Creatinine, Ser 6.21 0.44 - 1.00 mg/dL   Calcium 9.5 8.9 - 30.8 mg/dL   GFR, Estimated >65 >78 mL/min   Anion gap 11 5 - 15  hCG, quantitative, pregnancy     Status: Abnormal   Collection Time: 06/27/22 11:25 PM  Result Value Ref Range   hCG, Beta Chain, Quant, S 40,341 (H) <5 mIU/mL  HIV Antibody (routine testing w rflx)     Status: None   Collection Time: 06/27/22 11:25 PM  Result Value Ref Range    HIV Screen 4th Generation wRfx Non Reactive Non Reactive  Wet prep, genital     Status: Abnormal   Collection Time: 06/27/22 11:30 PM   Specimen: Vaginal  Result Value Ref Range   Yeast Wet Prep HPF POC NONE SEEN NONE SEEN   Trich, Wet Prep NONE SEEN NONE SEEN  Clue Cells Wet Prep HPF POC PRESENT (A) NONE SEEN   WBC, Wet Prep HPF POC <10 <10   Sperm NONE SEEN      --/--/PENDING (04/30 2325)  IMAGING US OB LESS THAN 14 WEEKS WITH OB TRANSVAGINAL  Result Date: 06/28/2022 CLINICAL DATA:  Bleeding during pregnancy. EXAM: OBSTETRIC <14 WK [place] AND TRANSVAGINAL OB US TECHNIQUE: Both transabdominal and transvaginal ultrasound examinations were performed for complete evaluation of the gestation as well as the maternal uterus, adnexal regions, and pelvic cul-de-sac. Transvaginal technique was performed to assess early pregnancy. COMPARISON:  None Available. FINDINGS: Intrauterine gestational sac: Single Yolk sac:  Visualized. Embryo:  Not Visualized. MSD: 12.7 mm   6 w   1 d Subchorionic hemorrhage:  None visualized. Maternal uterus/adnexae: The bilateral ovaries appear within normal limits. No pelvic free fluid. IMPRESSION: 1. Single intrauterine gestational sac identified containing yolk sac only corresponding to gestational age of [redacted] weeks and 1 day. No fetal pole identified which is within normal limits for a sac of this size. Recommend short-term follow-up ultrasound to confirm viability. Electronically Signed   By: Darliss Cheney M.D.   On: 06/28/2022 00:04     MAU Management/MDM: I have reviewed the triage vital signs and the nursing notes.   Pertinent labs & imaging results that were available during my care of the patient were reviewed by me and considered in my medical decision making (see chart for details).      I have reviewed her medical records including past results, notes and treatments. Medical, Surgical, and family history were reviewed.  Medications and recent lab tests were  reviewed  Ordered usual first trimester r/o ectopic labs.   Pelvic cultures done Will check baseline Ultrasound to rule out ectopic.   Treatments in MAU included [place] which showed Gestational Sac and Yolk sac.   This bleeding/pain can represent a normal pregnancy with bleeding, spontaneous abortion or even an ectopic which can be life-threatening.  The process as listed above helps to determine which of these is present.  Discussed findings suggest an intrauterine pregnancy, thus making ectopic pregnancy unlikely.  Embryo not seen, could be either too earl to see and normal or possible abnormal pregnancy.  ASSESSMENT Pregnancy at [redacted]w[redacted]d Bleeding in early pregnancy Pregnancy of unknown location  PLAN Discharge home Will repeat  Ultrasound in about 7-10 days at her OB office at [facility]  SAB precautions  Pt stable at time of discharge. Encouraged to return here if she develops worsening of symptoms, increase in pain, fever, or other concerning symptoms.    Wynelle Bourgeois CNM, MSN Certified Nurse-Midwife 06/28/2022  12:39 AM

## 2022-06-28 NOTE — Progress Notes (Signed)
Written and verbal d/c instructions given and understanding voiced. 

## 2023-08-01 ENCOUNTER — Ambulatory Visit: Payer: Self-pay

## 2023-08-01 ENCOUNTER — Encounter: Payer: Self-pay | Admitting: Family Medicine

## 2023-08-01 ENCOUNTER — Ambulatory Visit (INDEPENDENT_AMBULATORY_CARE_PROVIDER_SITE_OTHER): Admitting: Family Medicine

## 2023-08-01 VITALS — BP 125/87 | HR 73 | Temp 98.0°F | Ht 62.0 in | Wt 121.0 lb

## 2023-08-01 DIAGNOSIS — F419 Anxiety disorder, unspecified: Secondary | ICD-10-CM

## 2023-08-01 DIAGNOSIS — F53 Postpartum depression: Secondary | ICD-10-CM

## 2023-08-01 MED ORDER — ESCITALOPRAM OXALATE 20 MG PO TABS
20.0000 mg | ORAL_TABLET | Freq: Every day | ORAL | 0 refills | Status: DC
Start: 2023-08-01 — End: 2023-11-02

## 2023-08-01 NOTE — Patient Instructions (Signed)
 [facility]  Phone:  [phone]  Address:  [address]  [place], [place] [address]  Hours:  Open 24/7, No appointment required.    The [facility] ([facility] is centrally located in the [place] at [address], [place]

## 2023-08-01 NOTE — Progress Notes (Signed)
 Subjective:  Patient ID: Sara Petersen, female    DOB: 01-Aug-2000, 23 y.o.   MRN: [id number]  Patient Care Team: Lorel Roes, NP as PCP - General (Nurse Practitioner)   Chief Complaint:  postpartum depression  HPI: Sara Petersen is a 23 y.o. female presenting on 08/01/2023 for postpartum depression  HPI Patient presents today to discuss postpartum depression. She has not been seen at [facility] since August of 2022. She was mainly receiving ob/gyn care. Daughter receives care in [place]. Notes that she was previously taking vyvanse  for ADD. Has not taken it in over 2 years.  Delivered 02/19/23, 5 months postpartum. States that she is more anxious now than she has been in the past. States that she cannot control it. States that she is irritable, annoyed, cannot sit still, has to be doing something at all times. Reports that she gets frustrated with her daughter easily. Reports that she feels overstimulated easily. Notes that she has mood swings and crying spells. She has not tried any treatments tried in the past. She was prescribed zoloft while pregnant but she never took it because she was scared of side effects. Not currently breastfeeding. Denies thoughts of self harm or thoughts of harming child. Denies previous attempts at harm. Denies desire for counseling.   Relevant past medical, surgical, family, and social history reviewed and updated as indicated.  Allergies and medications reviewed and updated. Data reviewed: Chart in Epic.   Past Medical History:  Diagnosis Date   ADD (attention deficit disorder)     Past Surgical History:  Procedure Laterality Date   WISDOM TOOTH EXTRACTION      Social History   Socioeconomic History   Marital status: Significant Other    Spouse name: Not on file   Number of children: Not on file   Years of education: Not on file   Highest education level: Not on file  Occupational History   Not on file  Tobacco Use   Smoking  status: Never   Smokeless tobacco: Never  Vaping Use   Vaping status: Every Day   Substances: Nicotine  Substance and Sexual Activity   Alcohol use: No   Drug use: No   Sexual activity: Yes    Birth control/protection: None  Other Topics Concern   Not on file  Social History Narrative   Not on file   Social Drivers of Health   Financial Resource Strain: Low Risk  (04/17/2023)   Received from [facility]   Overall Financial Resource Strain (CARDIA)    Difficulty of Paying Living Expenses: Not hard at all  Food Insecurity: No Food Insecurity (04/17/2023)   Received from [facility]   Hunger Vital Sign    Worried About Running Out of Food in the Last Year: Never true    Ran Out of Food in the Last Year: Never true  Transportation Needs: No Transportation Needs (04/17/2023)   Received from [facility] - Transportation    Lack of Transportation (Medical): No    Lack of Transportation (Non-Medical): No  Physical Activity: Not on file  Stress: No Stress Concern Present (03/16/2023)   Received from [facility] - Occupational Stress Questionnaire    Feeling of Stress : Not at all  Social Connections: Unknown (03/16/2023)   Received from [facility]   Social Connection and Isolation Panel [NHANES]    Frequency of Communication with Friends and Family: More than three times  a week    Frequency of Social Gatherings with Friends and Family: Twice a week    Attends Religious Services: Not on Marketing executive or Organizations: Not on file    Attends Banker Meetings: Not on file    Marital Status: Not on file  Intimate Partner Violence: Not At Risk (02/18/2023)   Received from [facility]   Humiliation, Afraid, Rape, and Kick questionnaire    Fear of Current or Ex-Partner: No    Emotionally Abused: No    Physically Abused: No    Sexually Abused: No    Outpatient Encounter Medications as of  08/01/2023  Medication Sig   [DISCONTINUED] lisdexamfetamine (VYVANSE ) 40 MG capsule Take 1 capsule (40 mg total) by mouth every morning.   No facility-administered encounter medications on file as of 08/01/2023.    No Known Allergies  Review of Systems As per HPI  Objective:  BP 125/87   Pulse 73   Temp 98 F (36.7 C)   Ht 5\' 2"  (1.575 m)   Wt 121 lb (54.9 kg)   LMP 07/28/2023 (Exact Date)   SpO2 100%   Breastfeeding No   BMI 22.13 kg/m    Wt Readings from Last 3 Encounters:  08/01/23 121 lb (54.9 kg)  06/27/22 127 lb (57.6 kg)  10/11/20 151 lb (68.5 kg)   Physical Exam Constitutional:      General: She is awake. She is not in acute distress.    Appearance: Normal appearance. She is well-developed and well-groomed. She is not ill-appearing, toxic-appearing or diaphoretic.  Cardiovascular:     Rate and Rhythm: Normal rate and regular rhythm.     Pulses: Normal pulses.     Heart sounds: Normal heart sounds. No murmur heard.    No gallop.  Pulmonary:     Effort: Pulmonary effort is normal. No respiratory distress.     Breath sounds: Normal breath sounds. No stridor. No wheezing, rhonchi or rales.  Musculoskeletal:     Cervical back: Full passive range of motion without pain and neck supple.  Skin:    General: Skin is warm.     Capillary Refill: Capillary refill takes less than 2 seconds.  Neurological:     General: No focal deficit present.     Mental Status: She is alert, oriented to person, place, and time and easily aroused. Mental status is at baseline.     GCS: GCS eye subscore is 4. GCS verbal subscore is 5. GCS motor subscore is 6.     Motor: No weakness.  Psychiatric:        Attention and Perception: Attention and perception normal.        Mood and Affect: Mood and affect normal.        Speech: Speech normal.        Behavior: Behavior normal. Behavior is cooperative.        Thought Content: Thought content normal. Thought content does not include homicidal or  suicidal ideation. Thought content does not include homicidal or suicidal plan.        Cognition and Memory: Cognition and memory normal.        Judgment: Judgment normal.     Results for orders placed or performed during the hospital encounter of 06/27/22  Urinalysis, Routine w reflex microscopic -Urine, Clean Catch   Collection Time: 06/27/22 10:35 PM  Result Value Ref Range   Color, Urine STRAW (A) YELLOW   APPearance HAZY (A)  CLEAR   Specific Gravity, Urine 1.005 1.005 - 1.030   pH 6.0 5.0 - 8.0   Glucose, UA NEGATIVE NEGATIVE mg/dL   Hgb urine dipstick LARGE (A) NEGATIVE   Bilirubin Urine NEGATIVE NEGATIVE   Ketones, ur 5 (A) NEGATIVE mg/dL   Protein, ur NEGATIVE NEGATIVE mg/dL   Nitrite NEGATIVE NEGATIVE   Leukocytes,Ua NEGATIVE NEGATIVE   RBC / HPF 0-5 0 - 5 RBC/hpf   WBC, UA 0-5 0 - 5 WBC/hpf   Bacteria, UA NONE SEEN NONE SEEN   Squamous Epithelial / HPF 6-10 0 - 5 /HPF   Mucus PRESENT   Pregnancy, urine POC   Collection Time: 06/27/22 10:38 PM  Result Value Ref Range   Preg Test, Ur POSITIVE (A) NEGATIVE  GC/Chlamydia probe amp ([place])not at [facility]   Collection Time: 06/27/22 11:13 PM  Result Value Ref Range   Neisseria Gonorrhea Negative    Chlamydia Negative    Comment Normal Reference Ranger Chlamydia - Negative    Comment      Normal Reference Range Neisseria Gonorrhea - Negative  CBC   Collection Time: 06/27/22 11:25 PM  Result Value Ref Range   WBC 11.2 (H) 4.0 - 10.5 K/uL   RBC 4.13 3.87 - 5.11 MIL/uL   Hemoglobin 12.8 12.0 - 15.0 g/dL   HCT 16.1 09.6 - 04.5 %   MCV 90.3 80.0 - 100.0 fL   MCH 31.0 26.0 - 34.0 pg   MCHC 34.3 30.0 - 36.0 g/dL   RDW 40.9 81.1 - 91.4 %   Platelets 250 150 - 400 K/uL   nRBC 0.0 0.0 - 0.2 %  Basic metabolic panel   Collection Time: 06/27/22 11:25 PM  Result Value Ref Range   Sodium 136 135 - 145 mmol/L   Potassium 3.3 (L) 3.5 - 5.1 mmol/L   Chloride 103 98 - 111 mmol/L   CO2 22 22 - 32 mmol/L   Glucose, Bld 100  (H) 70 - 99 mg/dL   BUN 6 6 - 20 mg/dL   Creatinine, Ser 7.82 0.44 - 1.00 mg/dL   Calcium 9.5 8.9 - 95.6 mg/dL   GFR, Estimated >21 >30 mL/min   Anion gap 11 5 - 15  hCG, quantitative, pregnancy   Collection Time: 06/27/22 11:25 PM  Result Value Ref Range   hCG, Beta Chain, Quant, S 40,341 (H) <5 mIU/mL  HIV Antibody (routine testing w rflx)   Collection Time: 06/27/22 11:25 PM  Result Value Ref Range   HIV Screen 4th Generation wRfx Non Reactive Non Reactive  ABO/Rh   Collection Time: 06/27/22 11:25 PM  Result Value Ref Range   ABO/RH(D) A POS    No rh immune globuloin      NOT A RH IMMUNE GLOBULIN CANDIDATE, PT RH POSITIVE Performed at [facility], [address] [place], [place] [address]   Wet prep, genital   Collection Time: 06/27/22 11:30 PM   Specimen: Vaginal  Result Value Ref Range   Yeast Wet Prep HPF POC NONE SEEN NONE SEEN   Trich, Wet Prep NONE SEEN NONE SEEN   Clue Cells Wet Prep HPF POC PRESENT (A) NONE SEEN   WBC, Wet Prep HPF POC <10 <10   Sperm NONE SEEN        08/01/2023   11:28 AM 10/11/2020    4:01 PM 07/06/2020   10:28 AM 03/30/2020    3:15 PM 03/03/2020    2:53 PM  Depression screen PHQ 2/9  Decreased  Interest 3 0 0 0 0  Down, Depressed, Hopeless 3 0 0 0 0  PHQ - 2 Score 6 0 0 0 0  Altered sleeping 2 0 0    Tired, decreased energy 3 0 0    Change in appetite 0 0 0    Feeling bad or failure about yourself  0 0 0    Trouble concentrating 3 0 0    Moving slowly or fidgety/restless 2 0 0    Suicidal thoughts 0 0 0    PHQ-9 Score 16 0 0    Difficult doing work/chores Somewhat difficult           08/01/2023   11:29 AM  GAD 7 : Generalized Anxiety Score  Nervous, Anxious, on Edge 3  Control/stop worrying 3  Worry too much - different things 3  Trouble relaxing 3  Restless 1  Easily annoyed or irritable 3  Afraid - awful might happen 1  Total GAD 7 Score 17  Anxiety Difficulty Very difficult   Pertinent labs & imaging results that were  available during my care of the patient were reviewed by me and considered in my medical decision making.  Assessment & Plan:  [place] was seen today for postpartum depression.  Diagnoses and all orders for this visit:  Postpartum depression Pt screened positive for depression and anxiety today. Pt offered nonpharmacologic and pharmacologic therapy. Pt declined counseling referral at this time. Safety contract established today with patient in clinic. Denies intent to harm herself or others. Will start medication as below. Patient to cut in half for first 8 days, then start 20 mg. Discussed side effects and BBW. Provided patient with contact information for [facility] urgent care if needed. Will plan for labs in 3-4 weeks.  .-     escitalopram (LEXAPRO) 20 MG tablet; Take 1 tablet (20 mg total) by mouth daily.  Anxiety As above.  -     escitalopram (LEXAPRO) 20 MG tablet; Take 1 tablet (20 mg total) by mouth daily.  Continue all other maintenance medications.  Follow up plan: Return in about 3 weeks (around 08/22/2023) for depression start. Patient to complete labs in 3 weeks with me or set up an establish care appt with another provider and receive labs in 6 weeks.   Continue healthy lifestyle choices, including diet (rich in fruits, vegetables, and lean proteins, and low in salt and simple carbohydrates) and exercise (at least 30 minutes of moderate physical activity daily).  Written and verbal instructions provided   The above assessment and management plan was discussed with the patient. The patient verbalized understanding of and has agreed to the management plan. Patient is aware to call the clinic if they develop any new symptoms or if symptoms persist or worsen. Patient is aware when to return to the clinic for a follow-up visit. Patient educated on when it is appropriate to go to the emergency department.   Jacqualyn Mates, DNP-FNP [facility] [address] [place], [place] [address] [phone]

## 2023-08-01 NOTE — Telephone Encounter (Signed)
 Copied from CRM [id number]. Topic: Clinical - Red Word Triage >> Aug 01, 2023  9:30 AM Turkey B wrote: [facility] that prompted transfer to Nurse Triage: pt  Called in, has  postpartum depression  FYI Only or Action Required?: FYI only for provider  Patient was last seen in primary care on 02/16/2022. Called Nurse Triage reporting Depression. Symptoms began 5 months ago. Interventions attempted: Nothing. Symptoms are: gradually worsening.  Triage Disposition: See PCP When Office is Open (Within 3 Days)  Patient/caregiver understands and will follow disposition?: yes  Reason for Disposition  Depression symptoms occurring > 1 month after delivery  Answer Assessment - Initial Assessment Questions 1. CONCERN: "What happened that made you call today?"     C/o 5 months postpartum depression, states very irritated and frustrated when baby crys at night. 2. DEPRESSION SYMPTOM SCREENING: "How are you feeling overall?" (e.g., decreased energy, increased sleeping or difficulty sleeping, difficulty concentrating, feelings of sadness, guilt, hopelessness, or worthlessness; problems caring for baby)     Decreased energy, difficulty sleeping, concentrating 3. RISK OF HARM - SUICIDAL IDEATION:  "Do you ever have thoughts of hurting yourself or the baby?"  (e.g., yes, no; details).   - INTENT:  "Do you have thoughts of hurting yourself or the baby right NOW?" (e.g., yes, no, N/A)   - PLAN: "Do you have a specific plan for how you would do this?" (e.g., gun, knife, overdose, no plan, N/A)     denies 4. RISK OF HARM - HOMICIDAL IDEATION:  "Do you ever have thoughts of hurting or killing someone else?"  (e.g., yes, no, no but preoccupation with thoughts about death)   - INTENT:  "Do you have thoughts of hurting or killing someone right NOW?" (e.g., yes, no, N/A)   - PLAN: "Do you have a specific plan for how you would do this?" (e.g., gun, knife, no plan, N/A)      denies 5. FUNCTIONAL IMPAIRMENT: "How have  things been going for you overall?" "Are you able to care for your baby?" "Have you had more difficulty than usual doing your normal daily activities?"  (e.g., better, same, worse; self-care, school, work, interactions)     More difficulty with daily activities, more frustrated 6. SUPPORT: "Who is with you now?" "Who do you live with?" "Do you have family or friends who you can talk to, or come to help you with the baby?      Yes 7. THERAPIST: "Do you have a counselor or therapist? Name?"     no 8. STRESSORS: "Besides the birth of your baby, has there been any new stress or recent changes in your life?"     no 9. ALCOHOL USE OR SUBSTANCE USE (DRUG USE): "Do you drink alcohol or use any illegal drugs?"     no 10.  ABUSE: "In the past year, have you been hit, slapped, kicked, or otherwise physically hurt by someone?" (e.g., yes/no; who, what, when).        no 11. OTHER: "Do you have any other physical symptoms right now?" (e.g., fever)       no 12. DELIVERY DATE: "When was your delivery date?"       02/19/23  Protocols used: Postpartum - Depression-A-AH

## 2023-08-11 ENCOUNTER — Other Ambulatory Visit: Payer: Self-pay | Admitting: Family Medicine

## 2023-08-11 DIAGNOSIS — F53 Postpartum depression: Secondary | ICD-10-CM

## 2023-08-11 DIAGNOSIS — F419 Anxiety disorder, unspecified: Secondary | ICD-10-CM

## 2023-08-22 ENCOUNTER — Ambulatory Visit: Admitting: Family Medicine

## 2023-08-22 ENCOUNTER — Encounter: Payer: Self-pay | Admitting: Nurse Practitioner

## 2023-11-02 ENCOUNTER — Other Ambulatory Visit: Payer: Self-pay | Admitting: *Deleted

## 2023-11-02 DIAGNOSIS — F53 Postpartum depression: Secondary | ICD-10-CM

## 2023-11-02 DIAGNOSIS — F419 Anxiety disorder, unspecified: Secondary | ICD-10-CM

## 2023-11-02 MED ORDER — ESCITALOPRAM OXALATE 20 MG PO TABS
20.0000 mg | ORAL_TABLET | Freq: Every day | ORAL | 0 refills | Status: AC
Start: 2023-11-02 — End: ?

## 2023-11-02 NOTE — Telephone Encounter (Signed)
 Pt said she has switched drs. She is going to [facility]

## 2023-11-02 NOTE — Telephone Encounter (Signed)
 Marry pt NTBS by new provider 30-d given today
